# Patient Record
Sex: Male | Born: 2000 | Race: White | Hispanic: No | Marital: Single | Smoking: Current every day smoker
Health system: Southern US, Community
[De-identification: ages and names within clinical notes are randomized; demographics above are authoritative.]

## PROBLEM LIST (undated history)

## (undated) HISTORY — PX: LYMPHADENECTOMY: SHX15

---
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
## 2019-06-11 ENCOUNTER — Emergency Department (HOSPITAL_COMMUNITY)
Admission: EM | Admit: 2019-06-11 | Discharge: 2019-06-12 | Disposition: A | Discharge status: Home / Self Care | Payer: BC Managed Care – PPO | Attending: Emergency Medicine | Admitting: Emergency Medicine

## 2019-06-11 ENCOUNTER — Other Ambulatory Visit: Payer: Self-pay

## 2019-06-11 ENCOUNTER — Encounter (HOSPITAL_COMMUNITY): Payer: Self-pay | Admitting: Emergency Medicine

## 2019-06-11 DIAGNOSIS — F17210 Nicotine dependence, cigarettes, uncomplicated: Secondary | ICD-10-CM | POA: Insufficient documentation

## 2019-06-11 DIAGNOSIS — K0889 Other specified disorders of teeth and supporting structures: Secondary | ICD-10-CM | POA: Diagnosis present

## 2019-06-11 DIAGNOSIS — K0381 Cracked tooth: Secondary | ICD-10-CM | POA: Diagnosis not present

## 2019-06-11 DIAGNOSIS — S025XXB Fracture of tooth (traumatic), initial encounter for open fracture: Secondary | ICD-10-CM

## 2019-06-11 NOTE — ED Triage Notes (Signed)
Pt reports LL dental pain, states he was having pain and then broke tooth off further today. Has been doing advil and orajel without relief.

## 2019-06-12 MED ORDER — AMOXICILLIN 500 MG PO CAPS: 500.0000 mg | ORAL_CAPSULE | Freq: Three times a day (TID) | ORAL | 0 refills | Status: AC

## 2019-06-12 MED ORDER — OXYCODONE-ACETAMINOPHEN 5-325 MG PO TABS: 1.0000 | ORAL_TABLET | Freq: Three times a day (TID) | ORAL | 0 refills | Status: AC | PRN

## 2019-06-12 MED ORDER — OXYCODONE-ACETAMINOPHEN 5-325 MG PO TABS
1.0000 | ORAL_TABLET | Freq: Once | ORAL | Status: AC
  Administered 2019-06-12: 1 via ORAL
  Filled 2019-06-12: qty 1

## 2019-06-12 NOTE — ED Provider Notes (Signed)
[facility] EMERGENCY DEPARTMENT Provider Note   CSN: [id number] Arrival date & time: 06/11/19  2212     History   Chief Complaint Chief Complaint  Patient presents with  . Dental Pain    HPI Kevin Fuller is a 18 y.o. male with no pertinent past medical history who presents to the emergency department with a chief complaint of dental pain.  The patient reports that he fractured part of a left sided lower tooth 1 week ago.  The area was not significantly painful until earlier today when he cracked the tooth and a second location.  Pain was initially manageable so he did not contact a dentist for follow-up, pain has been constant and worsening since onset earlier today.  He also feels as if he is starting to have some swelling around the gum.  He reports that he has been taking ibuprofen and applying orajel with no relief.   No fevers, chills, facial or neck swelling, trismus, drooling, purulent discharge.      The history is provided by the patient. No language interpreter was used.    History reviewed. No pertinent past medical history.  There are no active problems to display for this patient.   Past Surgical History:  Procedure Laterality Date  . LYMPHADENECTOMY Left         Home Medications    Prior to Admission medications   Medication Sig Start Date End Date Taking? Authorizing Provider  amoxicillin (AMOXIL) 500 MG capsule Take 1 capsule (500 mg total) by mouth 3 (three) times daily for 5 days. 06/12/19 06/17/19  Jashaun Penrose A, PA-C  clindamycin (CLEOCIN) 300 MG capsule Take 1 capsule (300 mg total) by mouth 3 (three) times daily. 11/03/17   Doristine Devoid, PA-C  DM-Phenylephrine-Acetaminophen (COLD RELIEF PO) Take 2 tablets by mouth daily as needed.    [provider]  ibuprofen (ADVIL,MOTRIN) 200 MG tablet Take 400 mg by mouth every 6 (six) hours as needed.    [provider]  oxyCODONE-acetaminophen  (PERCOCET/ROXICET) 5-325 MG tablet Take 1 tablet by mouth every 8 (eight) hours as needed for severe pain. 06/12/19   Takeshi Teasdale A, PA-C    Family History No family history on file.  Social History Social History   Tobacco Use  . Smoking status: Current Every Day Smoker    Packs/day: 0.25    Types: Cigarettes  . Smokeless tobacco: Never Used  Substance Use Topics  . Alcohol use: Yes    Frequency: Never    Comment: occasional   . Drug use: No     Allergies   Patient has no known allergies.   Review of Systems Review of Systems  Constitutional: Negative for activity change.  HENT: Positive for dental problem. Negative for ear discharge, facial swelling, sinus pain, sneezing, tinnitus and trouble swallowing.   Respiratory: Negative for shortness of breath.   Cardiovascular: Negative for chest pain.  Gastrointestinal: Negative for abdominal pain, diarrhea, nausea and vomiting.  Musculoskeletal: Negative for back pain.  Skin: Negative for rash.     Physical Exam Updated Vital Signs BP (!) 130/98   Pulse 88   Temp 98.5 F (36.9 C)   Resp 12   SpO2 100%   Physical Exam Vitals signs and nursing note reviewed.  Constitutional:      Appearance: He is well-developed.  HENT:     Head: Normocephalic.     Mouth/Throat:     Mouth: Mucous membranes are moist.  Pharynx: Oropharynx is clear. Uvula midline.      Comments: Tooth is fracture to the level of the gingiva on the buccal aspect of the tooth with a pulp and dentin exposure.  No dental abscess.  Surrounding gingiva is unremarkable.  No purulent drainage. Eyes:     Conjunctiva/sclera: Conjunctivae normal.  Neck:     Musculoskeletal: Neck supple.  Cardiovascular:     Rate and Rhythm: Normal rate and regular rhythm.     Heart sounds: No murmur.  Pulmonary:     Effort: Pulmonary effort is normal.  Abdominal:     General: There is no distension.     Palpations: Abdomen is soft.  Skin:    General: Skin is  warm and dry.  Neurological:     Mental Status: He is alert.  Psychiatric:        Behavior: Behavior normal.      ED Treatments / Results  Labs (all labs ordered are listed, but only abnormal results are displayed) Labs Reviewed - No data to display  EKG None  Radiology No results found.  Procedures Procedures (including critical care time)  Medications Ordered in ED Medications  oxyCODONE-acetaminophen (PERCOCET/ROXICET) 5-325 MG per tablet 1 tablet (1 tablet Oral Given 06/12/19 0018)     Initial Impression / Assessment and Plan / ED Course  I have reviewed the triage vital signs and the nursing notes.  Pertinent labs & imaging results that were available during my care of the patient were reviewed by me and considered in my medical decision making (see chart for details).        18 year old male presenting with a left lower fractured tooth.  On exam, the tooth is fractured to the level of the gingiva on the buccal aspect with pulp and dentin exposure.  Pain controlled in the ER.    Temrex (temporary bonding cement) was used to create a cover the exposed pulp and dentin of the fractured tooth.  Patient reports improvement in pain after Temrex was applied.    Dental resources guide provided.  The patient was given amoxicillin for infection prophylaxis and a short course of pain medicine.  He is hemodynamically stable in no acute distress.  Safe for discharge home with outpatient follow-up at this time.  Final Clinical Impressions(s) / ED Diagnoses   Final diagnoses:  Open fracture of tooth, initial encounter    ED Discharge Orders         Ordered    amoxicillin (AMOXIL) 500 MG capsule  3 times daily     06/12/19 0042    oxyCODONE-acetaminophen (PERCOCET/ROXICET) 5-325 MG tablet  Every 8 hours PRN     06/12/19 0042           Frederik PearMcDonald, Cele Mote A, PA-C 06/12/19 0304    Zadie RhineWickline, Donald, MD 06/12/19 [phone]

## 2019-06-12 NOTE — Discharge Instructions (Signed)
Thank you for allowing me to care for you today in the Emergency Department.   We placed a temporary cement filling over the broken tooth today.  Hopefully it will stay in place until you can be seen by a dentist.  I have attached a dentist resource guide along with your discharge paperwork.  There are very few dentist office that have Friday hours, I would recommend calling the entire list to see if you can be seen tomorrow.  Take 650 mg of Tylenol or 600 mg of ibuprofen with food every 6 hours for pain.  You can alternate between these 2 medications every 3 hours if your pain returns.  For instance, you can take Tylenol at noon, followed by a dose of ibuprofen at 3, followed by second dose of Tylenol and 6.  Apply cool compresses to areas that are painful for 15 to 20 minutes as frequently as needed.  Avoid applying heat to your face.  Since the tooth is broken to the level of the gumline, take 1 tablet of amoxicillin every 8 hours for the next 5 days to prevent infection.  Return to the emergency department if you develop fevers, chills, significant swelling to one side of your face or neck, if you, unable to open your mouth, become unable to swallow, or develop other new, concerning symptoms.

## 2019-09-22 IMAGING — CT CT ABD-PELV W/ CM
2 of 4 series · 16 of 46 positions shown, 18 images · IV contrast (APPLIED)
Comparison: None.

CLINICAL DATA: Right upper quadrant abdominal pain for 1 week with
nausea and vomiting. Also with some right flank pain.

EXAM:
CT ABDOMEN AND PELVIS WITH CONTRAST
TECHNIQUE: Multidetector CT imaging of the abdomen and pelvis was performed
using the standard protocol following bolus administration of
intravenous contrast.
CONTRAST:  100mL 63NZBG-KVV IOPAMIDOL (63NZBG-KVV) INJECTION 61%

[Series 3: abd/ pelvis 5.0 i30f 2 · axial · 0.71mm/px · z∈[+778,+1223]mm · 13 of 97 slices shown, 15 images]
[im 4/97  soft-tissue]
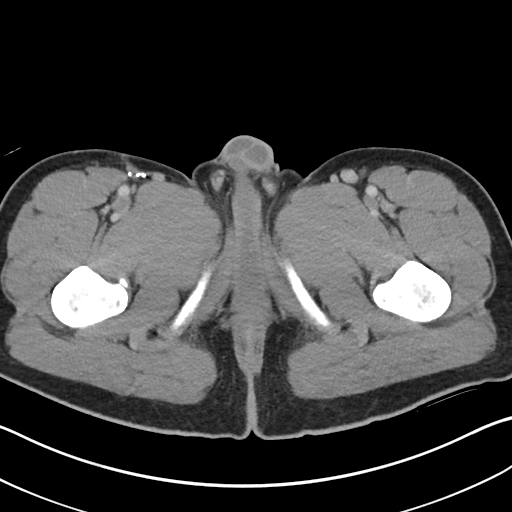
[im 4/97  bone]
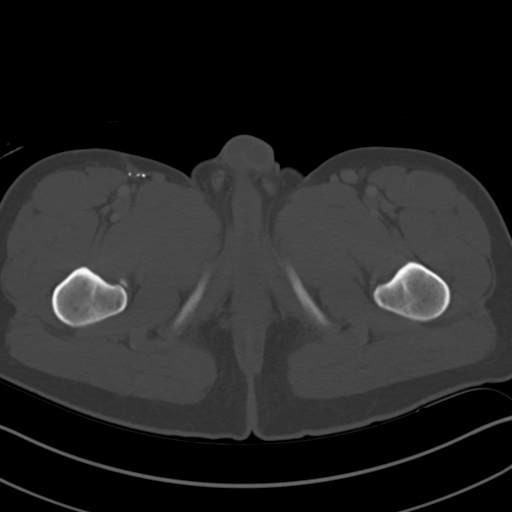
[im 12/97  soft-tissue]
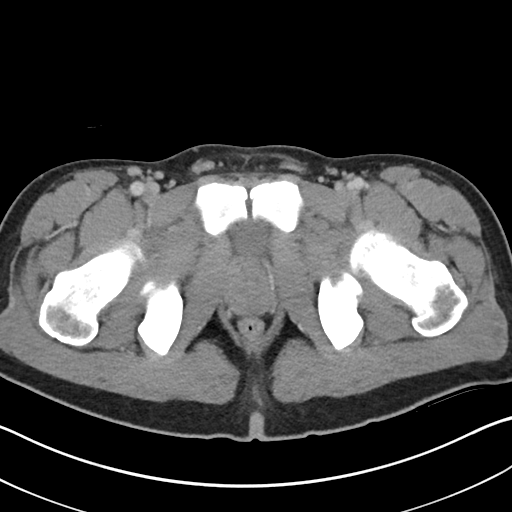
[im 19/97  soft-tissue]
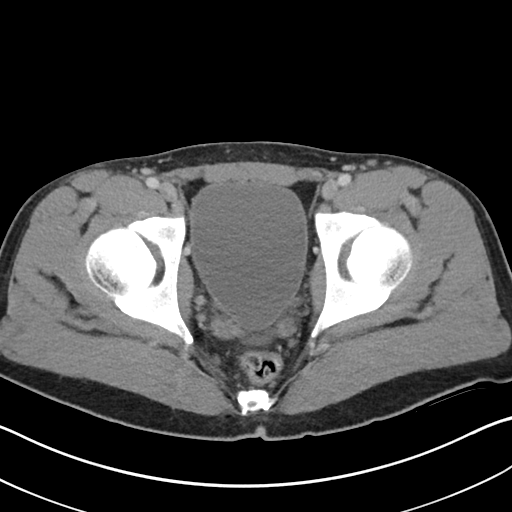
[im 26/97  soft-tissue]
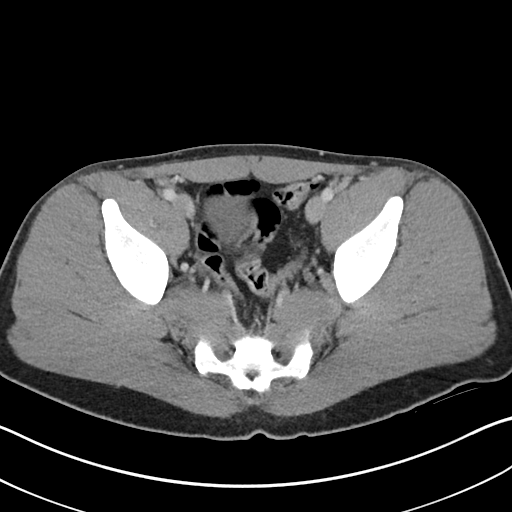
[im 34/97  soft-tissue]
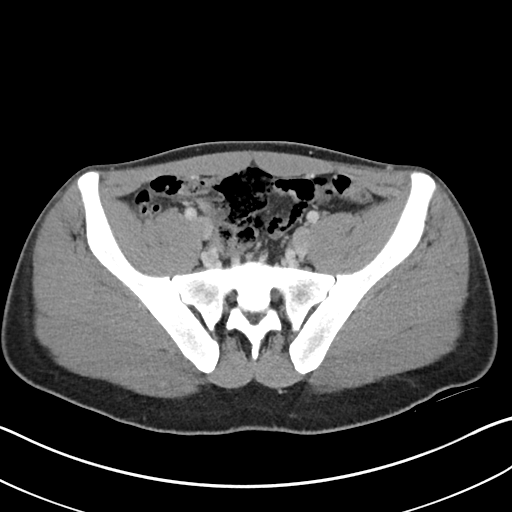
[im 41/97  soft-tissue]
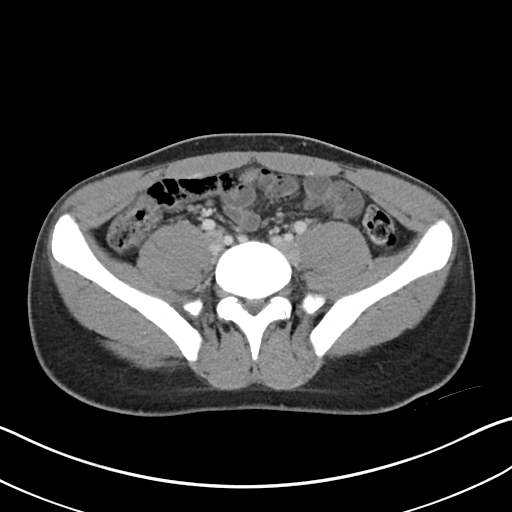
[im 49/97  soft-tissue]
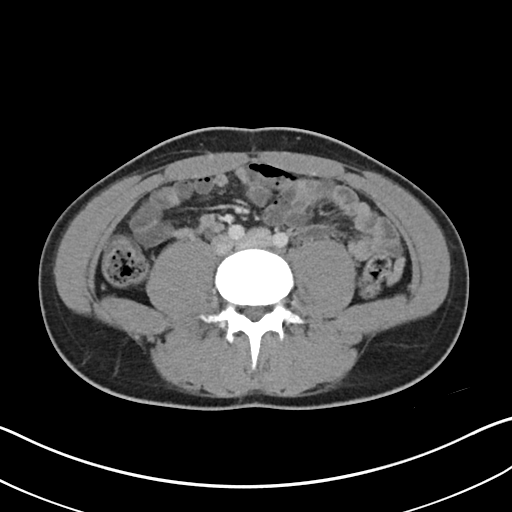
[im 56/97  soft-tissue]
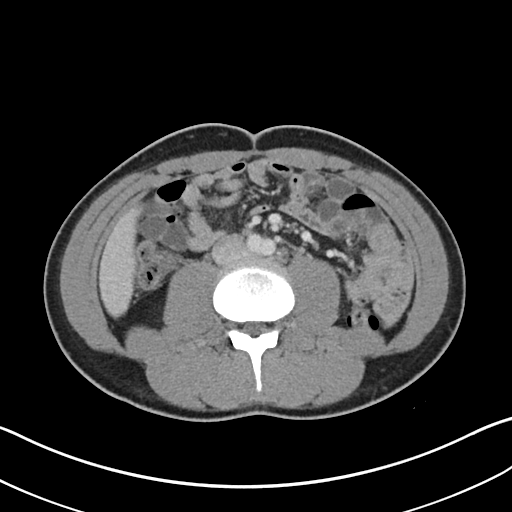
[im 63/97  soft-tissue]
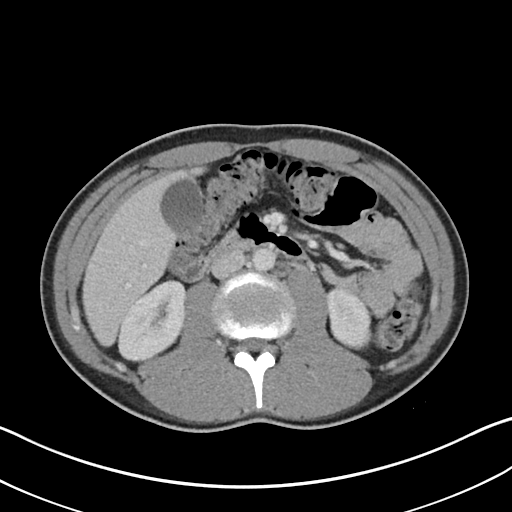
[im 63/97  bone]
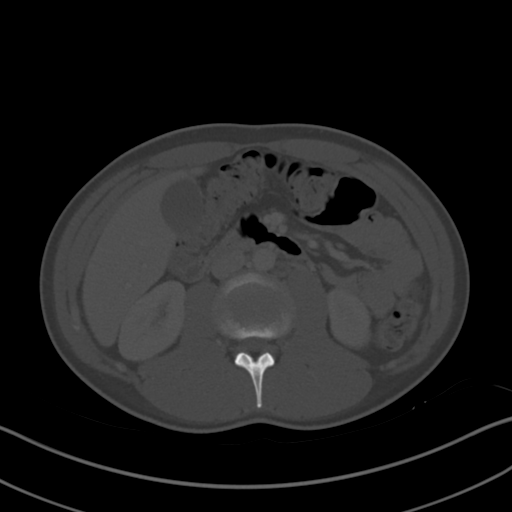
[im 71/97  soft-tissue]
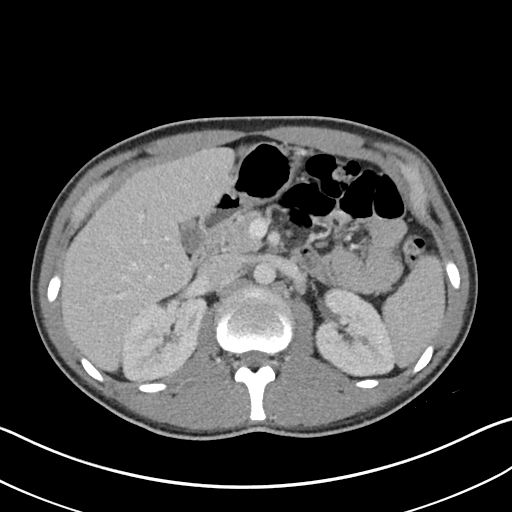
[im 78/97  soft-tissue]
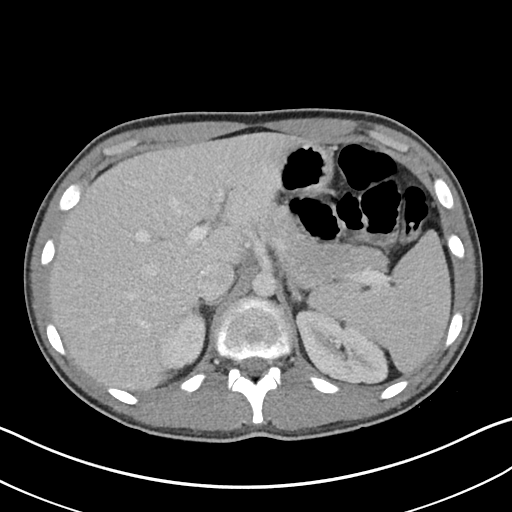
[im 85/97  soft-tissue]
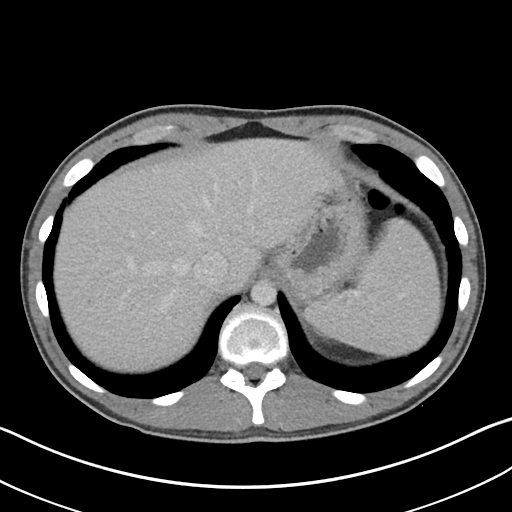
[im 93/97  soft-tissue]
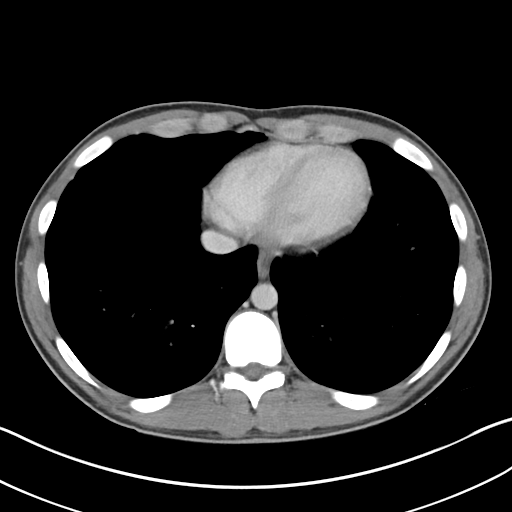

[Series 6: coronal soft tissue · coronal · 0.75mm/px · 3 of 100 slices shown]
[im 34/100  soft-tissue]
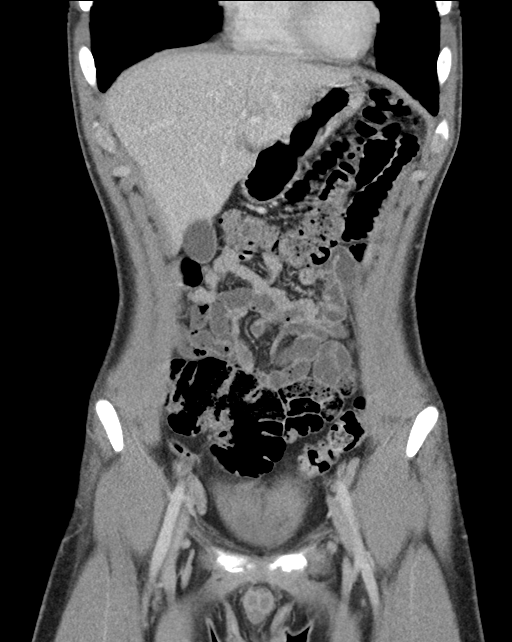
[im 45/100  soft-tissue]
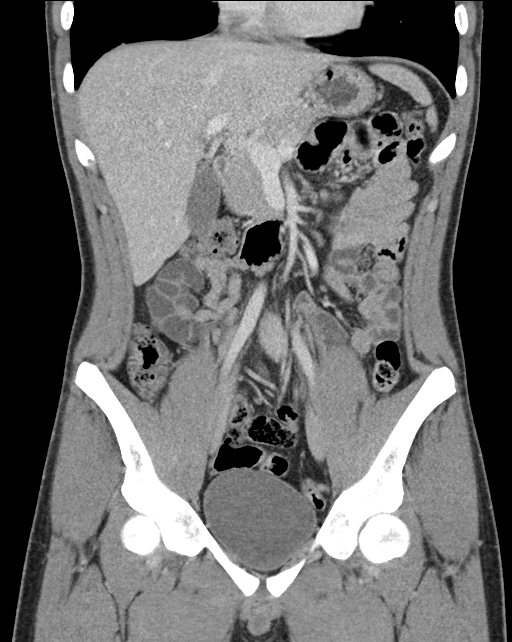
[im 56/100  soft-tissue]
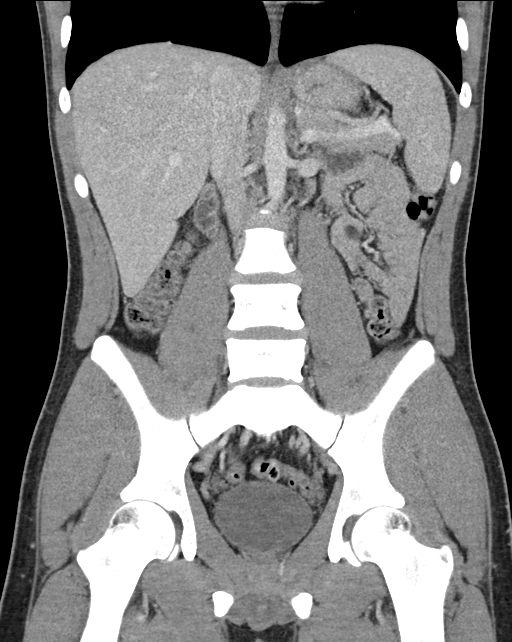

[16 of 46 positions shown; findings below may reference images not displayed]

FINDINGS: Lower chest: Clear lung bases.  Heart normal size.

Hepatobiliary: No focal liver abnormality is seen. No gallstones,
gallbladder wall thickening, or biliary dilatation.

Pancreas: Unremarkable. No pancreatic ductal dilatation or
surrounding inflammatory changes.

Spleen: Normal in size without focal abnormality.

Adrenals/Urinary Tract: Adrenal glands are unremarkable. Kidneys are
normal, without renal calculi, focal lesion, or hydronephrosis.
Bladder is unremarkable.

Stomach/Bowel: Stomach is within normal limits. Appendix appears
normal. No evidence of bowel wall thickening, distention, or
inflammatory changes.

Vascular/Lymphatic: No significant vascular findings are present. No
enlarged abdominal or pelvic lymph nodes.

Reproductive: Unremarkable.

Other: No abdominal wall hernia or abnormality. No abdominopelvic
ascites.

Musculoskeletal: No acute or significant osseous findings.
IMPRESSION: Normal enhanced CT scan of the abdomen and pelvis.
